# Patient Record
Sex: Female | Born: 1990 | Hispanic: Yes | Marital: Single
Health system: Southern US, Community
[De-identification: ages and names within clinical notes are randomized; demographics above are authoritative.]

---

## 2009-04-17 ENCOUNTER — Emergency Department: Payer: Self-pay | Admitting: Emergency Medicine

## 2010-01-16 ENCOUNTER — Ambulatory Visit: Payer: Self-pay | Admitting: Family Medicine

## 2011-11-15 ENCOUNTER — Emergency Department: Payer: Self-pay | Admitting: Emergency Medicine

## 2011-11-15 LAB — URINALYSIS, COMPLETE
Bacteria: NONE SEEN
Bilirubin,UR: NEGATIVE
Blood: NEGATIVE
Glucose,UR: NEGATIVE mg/dL (ref 0–75)
Hyaline Cast: 2
Nitrite: NEGATIVE
Ph: 9 (ref 4.5–8.0)
Specific Gravity: 1.03 (ref 1.003–1.030)

## 2011-11-15 LAB — COMPREHENSIVE METABOLIC PANEL
Albumin: 4.2 g/dL (ref 3.4–5.0)
Anion Gap: 9 (ref 7–16)
Calcium, Total: 9.4 mg/dL (ref 8.5–10.1)
Chloride: 106 mmol/L (ref 98–107)
Co2: 27 mmol/L (ref 21–32)
EGFR (African American): >60
EGFR (Non-African Amer.): >60
Glucose: 117 mg/dL — ABNORMAL HIGH (ref 65–99)
Osmolality: 283 (ref 275–301)
Potassium: 3.4 mmol/L — ABNORMAL LOW (ref 3.5–5.1)
SGOT(AST): 79 U/L — ABNORMAL HIGH (ref 15–37)
Sodium: 142 mmol/L (ref 136–145)

## 2011-11-15 LAB — CBC
HCT: 41.1 % (ref 35.0–47.0)
HGB: 13.7 g/dL (ref 12.0–16.0)
MCHC: 33.3 g/dL (ref 32.0–36.0)
MCV: 83 fL (ref 80–100)
RDW: 14.9 % — ABNORMAL HIGH (ref 11.5–14.5)

## 2011-11-15 LAB — LIPASE, BLOOD: Lipase: 112 U/L (ref 73–393)

## 2011-12-08 ENCOUNTER — Inpatient Hospital Stay: Payer: Self-pay | Admitting: Surgery

## 2011-12-08 LAB — URINALYSIS, COMPLETE
Bilirubin,UR: NEGATIVE
Glucose,UR: NEGATIVE mg/dL (ref 0–75)
Nitrite: NEGATIVE
Ph: 6 (ref 4.5–8.0)
Protein: 30
Specific Gravity: 1.032 (ref 1.003–1.030)
Squamous Epithelial: 1

## 2011-12-08 LAB — COMPREHENSIVE METABOLIC PANEL
Bilirubin,Total: 0.8 mg/dL (ref 0.2–1.0)
Calcium, Total: 9.2 mg/dL (ref 8.5–10.1)
Chloride: 105 mmol/L (ref 98–107)
Co2: 26 mmol/L (ref 21–32)
Creatinine: 0.59 mg/dL — ABNORMAL LOW (ref 0.60–1.30)
EGFR (African American): >60
EGFR (Non-African Amer.): >60
Glucose: 118 mg/dL — ABNORMAL HIGH (ref 65–99)
Osmolality: 280 (ref 275–301)
Sodium: 139 mmol/L (ref 136–145)

## 2011-12-08 LAB — CBC
HCT: 37.3 % (ref 35.0–47.0)
HGB: 12.4 g/dL (ref 12.0–16.0)
RDW: 15.7 % — ABNORMAL HIGH (ref 11.5–14.5)
WBC: 12 10*3/uL — ABNORMAL HIGH (ref 3.6–11.0)

## 2011-12-09 LAB — COMPREHENSIVE METABOLIC PANEL
Anion Gap: 8 (ref 7–16)
Calcium, Total: 8.7 mg/dL (ref 8.5–10.1)
Chloride: 107 mmol/L (ref 98–107)
Co2: 27 mmol/L (ref 21–32)
EGFR (African American): >60
EGFR (Non-African Amer.): >60
Osmolality: 280 (ref 275–301)
Potassium: 3.4 mmol/L — ABNORMAL LOW (ref 3.5–5.1)
Sodium: 142 mmol/L (ref 136–145)

## 2011-12-09 LAB — CBC WITH DIFFERENTIAL/PLATELET
Basophil #: 0 10*3/uL (ref 0.0–0.1)
Basophil %: 0.7 %
Eosinophil #: 0.1 10*3/uL (ref 0.0–0.7)
HCT: 35.2 % (ref 35.0–47.0)
MCH: 27.1 pg (ref 26.0–34.0)
MCHC: 32.1 g/dL (ref 32.0–36.0)
MCV: 85 fL (ref 80–100)
Monocyte #: 0.3 x10 3/mm (ref 0.2–0.9)
Neutrophil %: 35.6 %
Platelet: 231 10*3/uL (ref 150–440)
RBC: 4.17 10*6/uL (ref 3.80–5.20)
RDW: 16 % — ABNORMAL HIGH (ref 11.5–14.5)
WBC: 4.5 10*3/uL (ref 3.6–11.0)

## 2011-12-09 LAB — APTT: Activated PTT: 29.2 secs (ref 23.6–35.9)

## 2011-12-09 LAB — PROTIME-INR: Prothrombin Time: 13 secs (ref 11.5–14.7)

## 2011-12-10 LAB — CBC WITH DIFFERENTIAL/PLATELET
Basophil #: 0 10*3/uL (ref 0.0–0.1)
Eosinophil #: 0.1 10*3/uL (ref 0.0–0.7)
Eosinophil %: 1.9 %
HCT: 33.5 % — ABNORMAL LOW (ref 35.0–47.0)
Lymphocyte %: 50.3 %
MCHC: 33.2 g/dL (ref 32.0–36.0)
MCV: 84 fL (ref 80–100)
Monocyte %: 9.5 %
Neutrophil #: 1.7 10*3/uL (ref 1.4–6.5)
Neutrophil %: 37.6 %
Platelet: 225 10*3/uL (ref 150–440)
RBC: 4.01 10*6/uL (ref 3.80–5.20)
RDW: 15.9 % — ABNORMAL HIGH (ref 11.5–14.5)

## 2011-12-10 LAB — COMPREHENSIVE METABOLIC PANEL
Anion Gap: 9 (ref 7–16)
Bilirubin,Total: 0.5 mg/dL (ref 0.2–1.0)
Chloride: 109 mmol/L — ABNORMAL HIGH (ref 98–107)
Co2: 27 mmol/L (ref 21–32)
Creatinine: 0.6 mg/dL (ref 0.60–1.30)
EGFR (African American): >60
EGFR (Non-African Amer.): >60
Glucose: 83 mg/dL (ref 65–99)
Sodium: 145 mmol/L (ref 136–145)
Total Protein: 6.1 g/dL — ABNORMAL LOW (ref 6.4–8.2)

## 2011-12-10 LAB — LIPASE, BLOOD: Lipase: 1769 U/L — ABNORMAL HIGH (ref 73–393)

## 2011-12-11 LAB — LIPASE, BLOOD: Lipase: 1690 U/L — ABNORMAL HIGH (ref 73–393)

## 2011-12-11 LAB — COMPREHENSIVE METABOLIC PANEL
Alkaline Phosphatase: 128 U/L (ref 50–136)
Anion Gap: 10 (ref 7–16)
BUN: 3 mg/dL — ABNORMAL LOW (ref 7–18)
Calcium, Total: 8.9 mg/dL (ref 8.5–10.1)
Chloride: 103 mmol/L (ref 98–107)
Co2: 28 mmol/L (ref 21–32)
EGFR (African American): >60
EGFR (Non-African Amer.): >60
Osmolality: 276 (ref 275–301)
Potassium: 3.4 mmol/L — ABNORMAL LOW (ref 3.5–5.1)
SGOT(AST): 21 U/L (ref 15–37)
SGPT (ALT): 79 U/L — ABNORMAL HIGH (ref 12–78)

## 2011-12-11 LAB — CBC WITH DIFFERENTIAL/PLATELET
Basophil #: 0 10*3/uL (ref 0.0–0.1)
Basophil %: 0.7 %
Eosinophil #: 0.1 10*3/uL (ref 0.0–0.7)
HCT: 35.7 % (ref 35.0–47.0)
Lymphocyte #: 1.8 10*3/uL (ref 1.0–3.6)
MCHC: 33.1 g/dL (ref 32.0–36.0)
MCV: 84 fL (ref 80–100)
Monocyte #: 0.4 x10 3/mm (ref 0.2–0.9)
Monocyte %: 8.2 %
Neutrophil #: 2.4 10*3/uL (ref 1.4–6.5)
Platelet: 239 10*3/uL (ref 150–440)
RDW: 15.9 % — ABNORMAL HIGH (ref 11.5–14.5)
WBC: 4.6 10*3/uL (ref 3.6–11.0)

## 2011-12-12 LAB — LIPASE, BLOOD: Lipase: 272 U/L (ref 73–393)

## 2011-12-13 LAB — PATHOLOGY REPORT

## 2014-06-14 NOTE — H&P (Signed)
Subjective/Chief Complaint RUQ pain    History of Present Illness n/v RUQ pain x 1 day nml BM no f/c started yesterday one month postpartum    Past History PMH none PSH none   Past Med/Surgical Hx:  Gallbladder Proplems:   Negative, patient denies medical history.:   ALLERGIES:  No Known Allergies:   Family and Social History:   Family History Non-Contributory    Social History negative tobacco, negative ETOH, fabrication    Place of Living Home   Review of Systems:   Fever/Chills No    Cough No    Abdominal Pain Yes    Diarrhea No    Constipation No    Nausea/Vomiting Yes    SOB/DOE No    Chest Pain No    Dysuria No    Tolerating Diet No  Nauseated    Medications/Allergies Reviewed Medications/Allergies reviewed   Physical Exam:   GEN no acute distress    HEENT pink conjunctivae    NECK supple    RESP normal resp effort  clear BS  no use of accessory muscles    CARD regular rate    ABD positive tenderness  soft  pos Murphy's    EXTR negative edema    PSYCH alert, A+O to time, place, person    Additional Comments no interpretor available. will review and consent upstairs on floor   Lab Results: Hepatic:  13-Oct-13 01:09    Bilirubin, Total 0.8   Alkaline Phosphatase  161   SGPT (ALT)  101   SGOT (AST)  152   Total Protein, Serum 8.2   Albumin, Serum 4.4  Routine Chem:  13-Oct-13 01:09    Glucose, Serum  118   BUN 16   Creatinine (comp)  0.59   Sodium, Serum 139   Potassium, Serum 3.5   Chloride, Serum 105   CO2, Serum 26   Calcium (Total), Serum 9.2   Osmolality (calc) 280   eGFR (African American) >60   eGFR (Non-African American) >60 (eGFR values <59m/min/1.73 m2 may be an indication of chronic kidney disease (CKD). Calculated eGFR is useful in patients with stable renal function. The eGFR calculation will not be reliable in acutely ill patients when serum creatinine is changing rapidly. It is not useful in  patients  on dialysis. The eGFR calculation may not be applicable to patients at the low and high extremes of body sizes, pregnant women, and vegetarians.)   Anion Gap 8   Lipase 130 (Result(s) reported on 08 Dec 2011 at 02:06AM.)  Routine UA:  13-Oct-13 03:08    Color (UA) Amber   Clarity (UA) Hazy   Glucose (UA) Negative   Bilirubin (UA) Negative   Ketones (UA) Trace   Specific Gravity (UA) 1.032   Blood (UA) 2+   pH (UA) 6.0   Protein (UA) 30 mg/dL   Nitrite (UA) Negative   Leukocyte Esterase (UA) Negative (Result(s) reported on 08 Dec 2011 at 04:34AM.)   RBC (UA) 28 /HPF   WBC (UA) 4 /HPF   Bacteria (UA) NONE SEEN   Epithelial Cells (UA) 1 /HPF   Mucous (UA) PRESENT (Result(s) reported on 08 Dec 2011 at 04:34AM.)  Routine Hem:  13-Oct-13 01:09    WBC (CBC)  12.0   RBC (CBC) 4.50   Hemoglobin (CBC) 12.4   Hematocrit (CBC) 37.3   Platelet Count (CBC) 302 (Result(s) reported on 08 Dec 2011 at 02:21AM.)   MCV 83   MCH 27.6   MCHC  33.3   RDW  15.7   Radiology Results: [place]:    13-Oct-13 05:32, US Abdomen Limited Survey   US Abdomen Limited Survey   REASON FOR EXAM:    KNOWN GSTONES; TONIGHT W/ INCREASED PAIN & VOMITING,   EVAL CHOLECYSTITIS  COMMENTS:   Body Site: GB and Fossa, CBD, Head of Pancreas    PROCEDURE: [place]  - US ABDOMEN LIMITED SURVEY  - Dec 08 2011  5:32AM     RESULT: Comparison: 11/16/2011    Technique: Multiple grayscale and color Doppler images were obtained of   the right upper quadrant.    Findings:  The visualized pancreas is unremarkable. Multiple gallstones are present.   No gallbladder wall thickening. There may be a trace amount of   pericholecystic fluid, similar to prior. The sonographic Percell Miller sign was     negative.    The common bile duct is dilated, measuring 8 mm. There is a round 5 mm   echogenic focus within the common bile duct which demonstrate shadowing   and is consistent with a stone.    IMPRESSION:   1. There is a 5 mm calculus within  the common bile duct causing mild   dilatation of the common bile duct.  2. Cholelithiasis. There is a trace amount of pericholecystic fluid   similar to prior. Otherwise, no sonographic evidence of acute   cholecystitis.    The preliminary report was provided to the emergency department shortly   after examination.    Verified By: Gregor Hams, M.D., MD     Assessment/Admission Diagnosis choledocholithiasis admit hydrtate IV abx trend LFT and plan LC or ERCP accordingly will review woith interpretor on floor   Electronic Signatures: Florene Glen (MD)  (Signed 13-Oct-13 07:37)  Authored: CHIEF COMPLAINT and HISTORY, PAST MEDICAL/SURGIAL HISTORY, ALLERGIES, FAMILY AND SOCIAL HISTORY, REVIEW OF SYSTEMS, PHYSICAL EXAM, LABS, Radiology, ASSESSMENT AND PLAN   Last Updated: 13-Oct-13 07:37 by Florene Glen (MD)

## 2014-06-14 NOTE — Discharge Summary (Signed)
PATIENT NAME:  Sandra Escobar, Sandra MR#:  [id number] DATE OF BIRTH:  1990/10/16  DATE OF ADMISSION:  12/08/2011 DATE OF DISCHARGE:  12/12/2011  ATTENDING PHYSICIAN: Dr. Ida Roguehristopher Shaely Gadberry   DISCHARGE DIAGNOSES:  1. Symptomatic cholelithiasis.  2. Post endoscopic retrograde cholangiopancreatography pancreatitis.   PROCEDURES PERFORMED:  1. ERCP 12/09/2011. 2. Laparoscopic cholecystectomy 12/11/2011.   DISCHARGE MEDICATION: Percocet 1 to 2 tabs p.o. q.4 hours p.r.n. pain.   HOSPITAL COURSE: [name] presented on 10/13 with recurrent right upper quadrant pain. She had an ultrasound at that time which showed gallstones and a dilated bile duct. She was admitted for symptomatic cholelithiasis, however, with her dilated bile duct we consulted gastroenterology and she underwent ERCP. She had a sphincterotomy but did not have any visualized stones. Postoperatively she had a lipase that was elevated yet was asymptomatic. We hydrated her and watched her for one day and continued to be asymptomatic. She underwent laparoscopic cholecystectomy on 10/16. Postoperatively she did fine. Her lipase had returned to close to normal postoperatively and pain was controlled with p.o. pain medications. She was discharged home in satisfactory condition.   DISCHARGE INSTRUCTIONS: She is sent home with a prescription for pain medication. She is told to call or return to ED if she has fevers, nausea, vomiting, increased pain or redness, drainage from incision. She is to follow up with me in approximately one week.   ____________________________ Si Raiderhristopher A. Amabel Stmarie, MD cal:cms D: 12/17/2011 15:37:28 ET T: 12/17/2011 15:52:20 ET  JOB#: [id number] cc: Cristal Deerhristopher A. Broox Lonigro, MD, <Dictator> Jarvis NewcomerHRISTOPHER A Rabab Currington MD ELECTRONICALLY SIGNED 12/20/2011 16:32

## 2014-06-14 NOTE — Consult Note (Signed)
Chief Complaint:   Subjective/Chief Complaint Elevated lipase but patient asymptomatic. Had GB removed earlier. Still no abd pain or nausea.   VITAL SIGNS/ANCILLARY NOTES: **Vital Signs.:   16-Oct-13 16:02   Vital Signs Type Post-Procedure   Temperature Temperature (F) 98.3   Celsius 36.8   Temperature Source Oral   Pulse Pulse 76   Respirations Respirations 18   Systolic BP Systolic BP 160   Diastolic BP (mmHg) Diastolic BP (mmHg) 66   Mean BP 80   Pulse Ox % Pulse Ox % 98   Pulse Ox Activity Level  At rest   Oxygen Delivery Room Air/ 21 %   Brief Assessment:   Respiratory clear BS    Gastrointestinal Normal   Lab Results: Hepatic:  16-Oct-13 04:49    Bilirubin, Total 0.6   Alkaline Phosphatase 128   SGPT (ALT)  79   SGOT (AST) 21   Total Protein, Serum 6.7   Albumin, Serum 3.4  Routine Chem:  16-Oct-13 04:49    Glucose, Serum 72   BUN  3   Creatinine (comp)  0.45   Sodium, Serum 141   Potassium, Serum  3.4   Chloride, Serum 103   CO2, Serum 28   Calcium (Total), Serum 8.9   Osmolality (calc) 276   eGFR (African American) >60   eGFR (Non-African American) >60 (eGFR values <23m/min/1.73 m2 may be an indication of chronic kidney disease (CKD). Calculated eGFR is useful in patients with stable renal function. The eGFR calculation will not be reliable in acutely ill patients when serum creatinine is changing rapidly. It is not useful in  patients on dialysis. The eGFR calculation may not be applicable to patients at the low and high extremes of body sizes, pregnant women, and vegetarians.)   Anion Gap 10   Lipase  1690 (Result(s) reported on 11 Dec 2011 at 05:35AM.)  Routine Hem:  16-Oct-13 04:49    WBC (CBC) 4.6   RBC (CBC) 4.27   Hemoglobin (CBC)  11.8   Hematocrit (CBC) 35.7   Platelet Count (CBC) 239   MCV 84   MCH 27.6   MCHC 33.1   RDW  15.9   Neutrophil % 51.0   Lymphocyte % 38.6   Monocyte % 8.2   Eosinophil % 1.5   Basophil % 0.7    Neutrophil # 2.4   Lymphocyte # 1.8   Monocyte # 0.4   Eosinophil # 0.1   Basophil # 0.0 (Result(s) reported on 11 Dec 2011 at 05:37AM.)   Assessment/Plan:  Assessment/Plan:   Assessment Post ERCP pancreatitis? S/P GB surgery. Stable.    Plan Agree with clear liquid diet. If tolerated with stable lipase tomorrow, can be discharged tomrrow. Will check back tomorrow if still here. Thanks.   Electronic Signatures: OVerdie Shire(MD)  (Signed 16-Oct-13 16:33)  Authored: Chief Complaint, VITAL SIGNS/ANCILLARY NOTES, Brief Assessment, Lab Results, Assessment/Plan   Last Updated: 16-Oct-13 16:33 by OVerdie Shire(MD)

## 2014-06-14 NOTE — Consult Note (Signed)
PATIENT NAME:  Sandra Escobar, Sandra Escobar MR#:  [id number] DATE OF BIRTH:  [birth year]  DATE OF CONSULTATION:  12/09/2011  REFERRING PHYSICIAN:   CONSULTING PHYSICIAN:  Scot Junobert T. Elliott, MD  HISTORY OF PRESENT ILLNESS: Patient is a 24 year old Hispanic female who speaks a little English was seen in the ER, found to have choledocholithiasis and was admitted to the hospital. She has a history of abdominal pain for one day. GI was consulted for possible ERCP.   PAST MEDICAL HISTORY: Patient is one month postpartum. She breast leads. She has not had any narcotics in 24 hours.   PAST SURGICAL HISTORY: None.   ALLERGIES: None.   CURRENT MEDICATIONS: None.   HABITS: Does not smoke or drink.   REVIEW OF SYSTEMS: Performed by admitting physician negative. No complaints of chest pain. No shortness of breath.   PHYSICAL EXAMINATION:  GENERAL: Pleasant Hispanic female in no acute distress.   HEENT: Sclerae nonicteric. Conjunctivae negative. Tongue is negative. The head is atraumatic.   NECK: Trachea is in the midline.   CHEST: Clear to auscultation.   HEART: No murmurs or gallops I can hear.   BREASTS: Exam not done at this time.   ABDOMEN: Bowel sounds present. No hepatosplenomegaly. No masses. No bruits. Minimal tenderness right upper quadrant.   VITAL SIGNS: Temperature 97.8, pulse 60, blood pressure 107/69, pulse oximetry 99%.   SKIN: Warm and dry.   NOTE: Patient was interviewed through hospital provided Spanish interpreter.   LABORATORY, DIAGNOSTIC AND RADIOLOGICAL DATA: Glucose 78, BUN 7, creatinine 0.55, sodium 142, potassium 3.4, chloride 107, CO2 27, calcium 8.7, lipase 130, total protein 6.5, albumin 3.3, total bilirubin 0.7, alkaline phosphatase 154, SGOT 84, SGPT 160. White count initially 12, repeat 4.5. Hemoglobin now 11.3, platelet count 231. Urinalysis shows blood, 28 red cells per high-powered field, negative nitrites, negative leukocyte esterase. An ultrasound of the  abdomen showed choledocholithiasis.   ASSESSMENT: Common bile duct stone.   RECOMMENDATIONS:  1. Check stat PT and PTT.  2. Keep her n.p.o.  3. No narcotics if patient is going to breast feed.  4. Proceed with ERCP with Dr. Bluford Kaufmannh. I have explained the procedure to the patient through the Spanish interpreter about the indications, expected results, possible complications including bleeding and pancreatitis. Dr. Bluford Kaufmannh will proceed this morning with an ERCP.   ____________________________ Scot Junobert T. Elliott, MD rte:cms D: 12/09/2011 09:02:18 ET T: 12/09/2011 09:14:44 ET JOB#: [id number]  cc: Scot Junobert T. Elliott, MD, <Dictator> Ezzard StandingPaul Y. Bluford Kaufmannh, MD Si Raiderhristopher A. Juliann PulseLundquist, MD  Scot JunOBERT T ELLIOTT MD ELECTRONICALLY SIGNED 12/18/2011 8:05

## 2014-06-14 NOTE — H&P (Signed)
PATIENT NAME:  Sandra Escobar, Sandra Escobar MR#:  [id number] DATE OF BIRTH:  [birth year]  DATE OF ADMISSION:  12/08/2011  CHIEF COMPLAINT: Right upper quadrant pain.   HISTORY OF PRESENT ILLNESS: This is a patient who speaks some English who is interviewed in the Emergency Room. I was called by the ED doctor who suggested the patient had choledocholithiasis. The patient describes one day of abdominal pain. She is one month postpartum and has never had an episode like this before. She has had some nausea and vomiting. Denies abnormal bowel movements. No jaundice or acholic stools. No fevers or chills.   PAST MEDICAL HISTORY: None.   PAST SURGICAL HISTORY: None.   ALLERGIES: None.   MEDICATIONS: None.   FAMILY HISTORY: Noncontributory.   SOCIAL HISTORY: The patient does not smoke or drink. She is one month postpartum and works as a Psychologist, occupationalfabricator.   REVIEW OF SYSTEMS: Ten system review was performed and negative with the exception of that mentioned in the history of present illness.   PHYSICAL EXAMINATION:  GENERAL: Healthy, comfortable-appearing Hispanic female patient.   VITAL SIGNS: Temperature 97.6, pulse 54, respirations 20, blood pressure 100/52. Pain scale of 0, 100% room air sat.   HEENT: No scleral icterus.   NECK: No palpable neck nodes.   CHEST: Clear to auscultation.   CARDIAC: Regular rate and rhythm.   ABDOMEN: Soft, minimally tender in the right upper quadrant. There is a questionable Murphy's sign.   EXTREMITIES: Without edema. Calves are nontender.   NEUROLOGIC: Grossly intact.   INTEGUMENT: No jaundice.   LABORATORY, RADIOLOGICAL AND DIAGNOSTIC DATA: Alkaline phosphatase 161, AST/ALT is 152/101. Bilirubin is normal. Electrolytes are normal. White blood cell count is 12.0. Hemoglobin and hematocrit of 12.4 and 37.3 and a platelet count of 302. An ultrasound suggests stone in the common bile duct with dilatation.   ASSESSMENT AND PLAN: This is a patient with  choledocholithiasis. No interpreter was available in the Emergency Room, but I was able to obtain the patient's history. I will review that history with an interpreter once one is available upstairs. The plan is to admit the patient to the hospital, hydrate, start IV antibiotics and then trend her liver function tests and decide upon laparoscopic cholecystectomy or ERCP accordingly. This will be discussed with her when she is on the floor.  ____________________________ Adah Salvageichard E. Excell Seltzerooper, MD rec:ap D: 12/08/2011 07:40:35 ET T: 12/08/2011 10:00:06 ET JOB#: [id number]  cc: Adah Salvageichard E. Excell Seltzerooper, MD, <Dictator> Lattie HawICHARD E Harvey Lingo MD ELECTRONICALLY SIGNED 12/08/2011 12:27

## 2014-06-14 NOTE — Consult Note (Signed)
CC: elevated lipase after ERCP.  She has elevated lipase but no complaints of abd pain and is not tender on my exam at this time.  Chest clear.  Will keep iv rate at 150cc/hr.  BUN is 4 so she is well hydrated.  I think this is not at this point a signif pancreatitis post ERCP but more of irritation and expect the lipase to fall pretty soon.  Electronic Signatures: Scot JunElliott, Tai Syfert T (MD)  (Signed on 15-Oct-13 12:10)  Authored  Last Updated: 15-Oct-13 12:10 by Scot JunElliott, Darald Uzzle T (MD)

## 2014-06-14 NOTE — Op Note (Signed)
PATIENT NAME:  Sandra Escobar, Mithra MR#:  [id number] DATE OF BIRTH:  [birth year]  DATE OF PROCEDURE:  12/11/2011  PREOPERATIVE DIAGNOSIS: Symptomatic cholelithiasis.   POSTOPERATIVE DIAGNOSIS:  Symptomatic cholelithiasis.  PROCEDURE PERFORMED: Laparoscopic cholecystectomy.   ESTIMATED BLOOD LOSS: 10 mL.  SURGEON: Kindall Swaby A. Alon Mazor, M.D.   ANESTHESIA: General.   SPECIMENS: Gallbladder.   INDICATION FOR SURGERY: [name] is a pleasant 24 year old female with gallstones, had recurrent right upper quadrant pain, who was interested in undergoing laparoscopic cholecystectomy.   DETAILS OF PROCEDURE: [name]' informed consent was obtained. She was brought in the operating room suite. She was laid supine on the operating room table. She was induced, endotracheal tube was placed, and general anesthesia was administered. Her abdomen was prepped and draped in standard surgical fashion. A transverse supraumbilical incision was made. This was taken down to the fascia. The fascia was incised. The peritoneum was entered with a gloved finger and there were no obvious adhesions. 2-0 Vicryl stay sutures were placed through the fascia. A Hassan trocar was placed in the abdomen.  The abdomen was insufflated.  The gallbladder was examined. There was some edema in the abdomen seconary to her post ERCP pancreatitis. Under direct visualization an 11-mm subxiphoid port was placed, and then two 5-mm ports were placed approximately 2 cm below the costal margin in the midclavicular and anterior axillary line. The gallbladder was then grasped with an instrument placed through the lateralmost port. The fundus was then retracted over the dome of the liver. The cystic duct and cystic artery were then dissected out. The critical view was obtained. The cystic duct and cystic artery were then clipped and ligated. The gallbladder was then taken off the gallbladder fossa using hook cautery  and taken out through a supraumbilical port in an Endo Catch bag. The fossa was examined. It was noted to be hemostatic. The trocars were then removed. The previously placed stay sutures were tied to close the supraumbilical fascia. The skin was then closed with 4-0 Monocryl deep dermal sutures.  Dermabond was then placed over the wounds.  The drapes were then taken down. The patient was then awakened and taken to the postanesthesia care unit. There were no immediate complications. The needle, sponge, and instrument counts were correct at the end of the procedure.     ____________________________ Si Raiderhristopher A. Markise Haymer, MD cal:bjt D: 12/11/2011 15:55:52 ET T: 12/11/2011 17:08:57 ET JOB#: [id number]  cc: Cristal Deerhristopher A. Renn Dirocco, MD, <Dictator> Jarvis NewcomerHRISTOPHER A Bertie Mcconathy MD ELECTRONICALLY SIGNED 12/15/2011 9:51

## 2014-06-14 NOTE — Consult Note (Signed)
ERCP performed. Filling defect seen on initial cholangiogram. However, multiple balloon sweeps showed no stones. GB filled. Ok to proceed with GB surgery. Ordered CMP and lipase for tomorrow AM. Thanks.  Electronic Signatures: Lylian Sanagustin (MLutricia Feil)  (Signed on 14-Oct-13 11:31)  Authored  Last Updated: 14-Oct-13 11:31 by Lutricia Feilh, Caralyn Twining (MD)

## 2016-07-08 ENCOUNTER — Emergency Department: Payer: No Typology Code available for payment source

## 2016-07-08 ENCOUNTER — Encounter: Payer: Self-pay | Admitting: Emergency Medicine

## 2016-07-08 ENCOUNTER — Emergency Department
Admission: EM | Admit: 2016-07-08 | Discharge: 2016-07-08 | Disposition: A | Discharge status: Home / Self Care | Payer: No Typology Code available for payment source | Attending: Emergency Medicine | Admitting: Emergency Medicine

## 2016-07-08 DIAGNOSIS — Y9389 Activity, other specified: Secondary | ICD-10-CM | POA: Diagnosis not present

## 2016-07-08 DIAGNOSIS — Y92410 Unspecified street and highway as the place of occurrence of the external cause: Secondary | ICD-10-CM | POA: Insufficient documentation

## 2016-07-08 DIAGNOSIS — Y999 Unspecified external cause status: Secondary | ICD-10-CM | POA: Insufficient documentation

## 2016-07-08 DIAGNOSIS — S0990XA Unspecified injury of head, initial encounter: Secondary | ICD-10-CM | POA: Diagnosis present

## 2016-07-08 DIAGNOSIS — M542 Cervicalgia: Secondary | ICD-10-CM | POA: Diagnosis not present

## 2016-07-08 DIAGNOSIS — R51 Headache: Secondary | ICD-10-CM | POA: Insufficient documentation

## 2016-07-08 MED ORDER — MELOXICAM 7.5 MG PO TABS: 7.5000 mg | ORAL_TABLET | Freq: Every day | ORAL | 1 refills | Status: AC
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
## 2018-11-18 ENCOUNTER — Other Ambulatory Visit: Payer: Self-pay | Admitting: Primary Care

## 2018-11-18 DIAGNOSIS — Z3482 Encounter for supervision of other normal pregnancy, second trimester: Secondary | ICD-10-CM

## 2018-11-26 ENCOUNTER — Other Ambulatory Visit: Payer: Self-pay

## 2018-11-26 ENCOUNTER — Ambulatory Visit
Admission: RE | Admit: 2018-11-26 | Discharge: 2018-11-26 | Disposition: A | Discharge status: Home / Self Care | Payer: Self-pay | Source: Ambulatory Visit | Attending: Primary Care | Admitting: Primary Care

## 2018-11-26 DIAGNOSIS — Z3482 Encounter for supervision of other normal pregnancy, second trimester: Secondary | ICD-10-CM | POA: Insufficient documentation

## 2018-12-08 ENCOUNTER — Other Ambulatory Visit: Payer: Self-pay | Admitting: Primary Care

## 2018-12-08 DIAGNOSIS — Z3482 Encounter for supervision of other normal pregnancy, second trimester: Secondary | ICD-10-CM

## 2018-12-25 ENCOUNTER — Ambulatory Visit
Admission: RE | Admit: 2018-12-25 | Discharge: 2018-12-25 | Disposition: A | Discharge status: Home / Self Care | Payer: Self-pay | Source: Ambulatory Visit | Attending: Primary Care | Admitting: Primary Care

## 2018-12-25 ENCOUNTER — Other Ambulatory Visit: Payer: Self-pay

## 2018-12-25 DIAGNOSIS — Z3482 Encounter for supervision of other normal pregnancy, second trimester: Secondary | ICD-10-CM | POA: Insufficient documentation

## 2019-11-15 IMAGING — US US OB COMP +14 WK
1 series · 13 of 28 positions shown · non-contrast
Comparison: none

CLINICAL DATA: Scan for anatomy. Patient is 28, gravida 4 para 3.

EXAM:
OBSTETRICAL ULTRASOUND >14 WKS

[Series 1: us ob comp +14 wk · 13 of 114 slices shown]
[im 5/114]
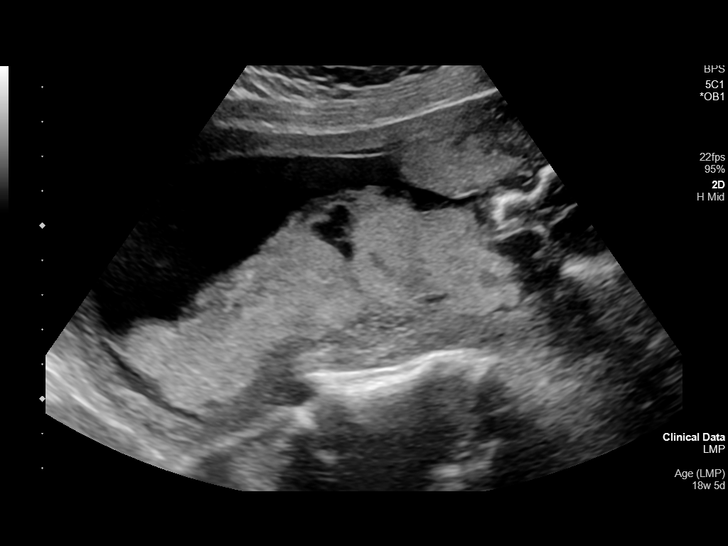
[im 13/114]
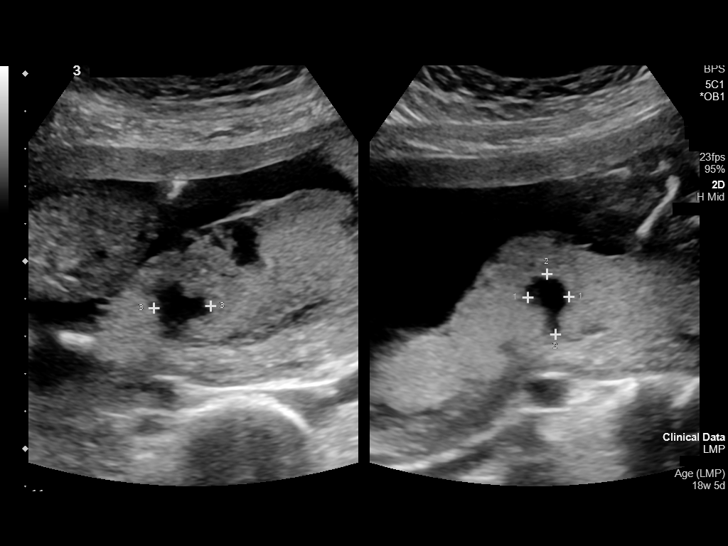
[im 21/114]
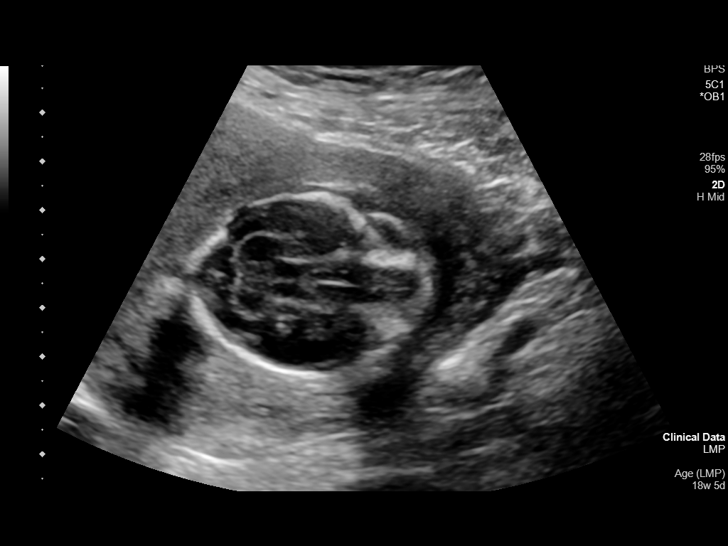
[im 30/114]
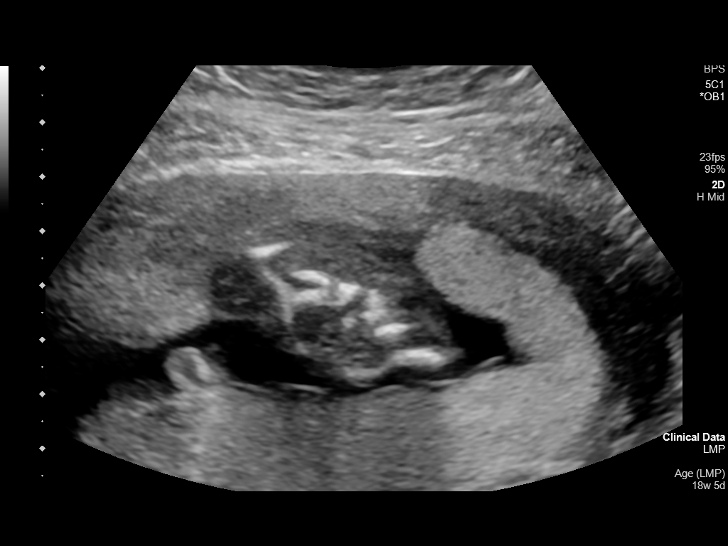
[im 38/114]
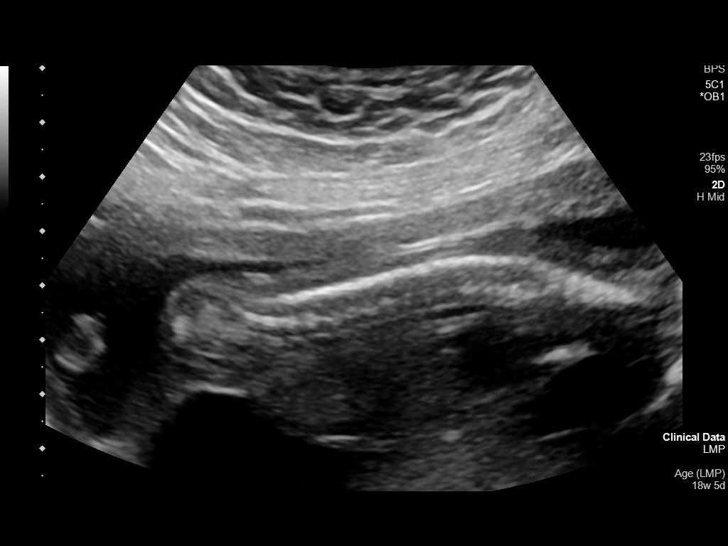
[im 47/114]
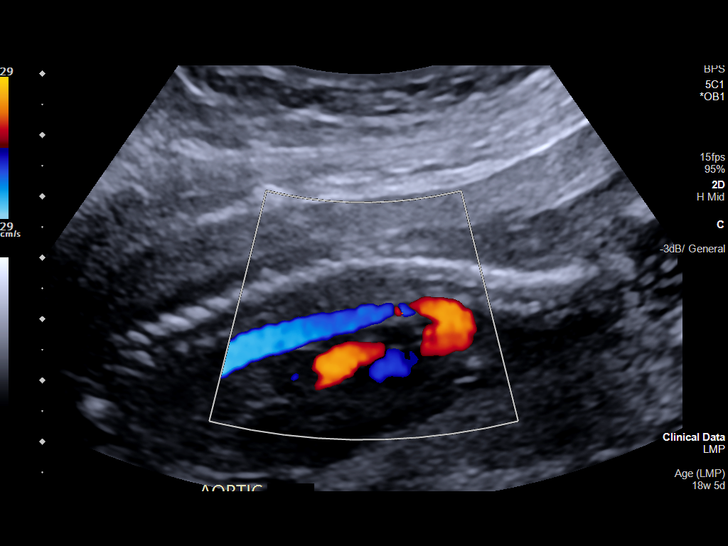
[im 59/114]
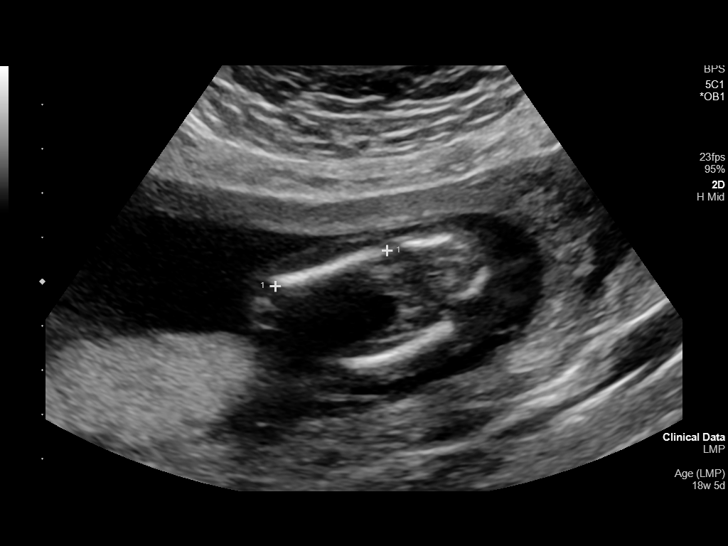
[im 67/114]
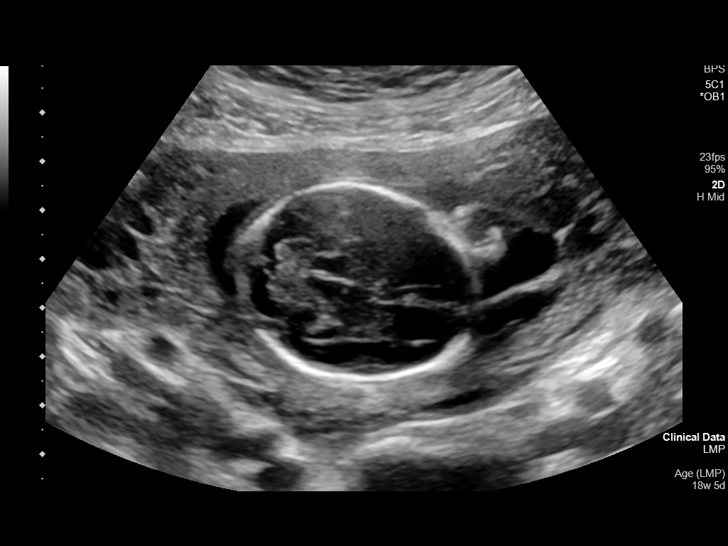
[im 76/114]
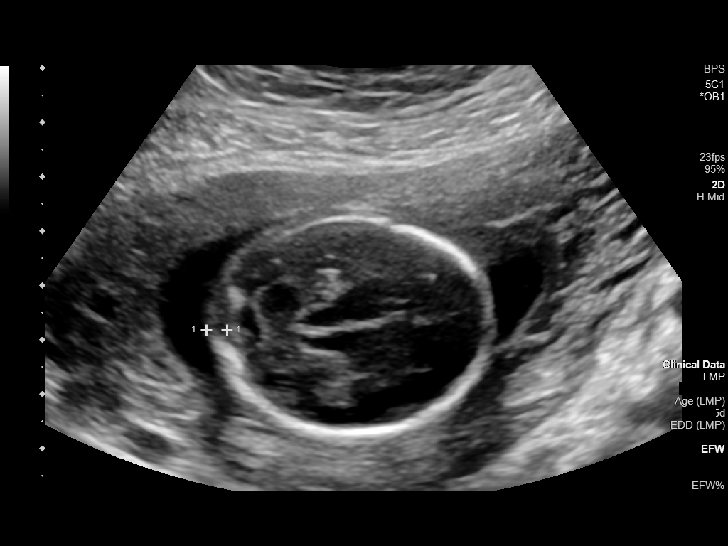
[im 84/114]
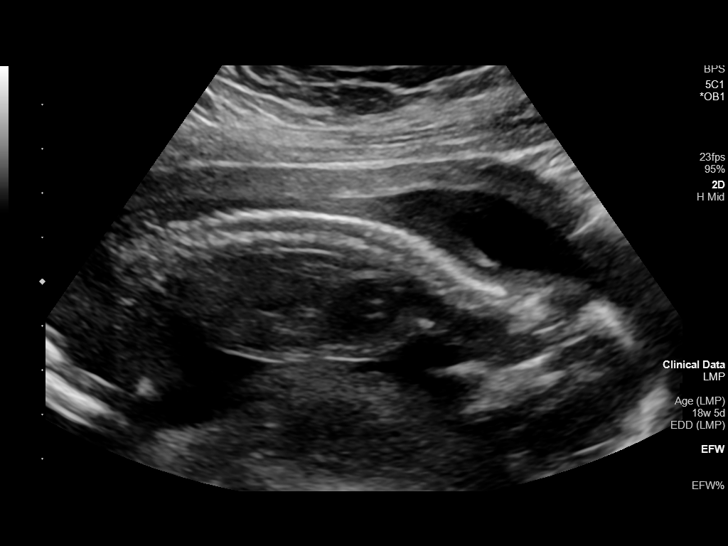
[im 93/114]
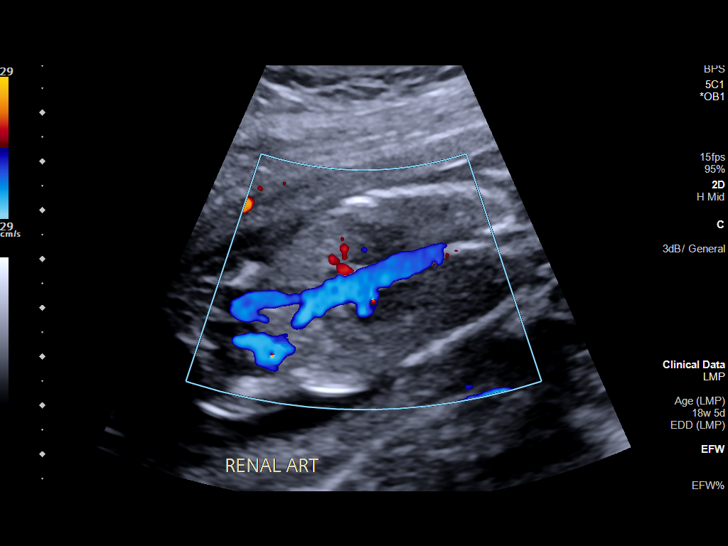
[im 101/114]
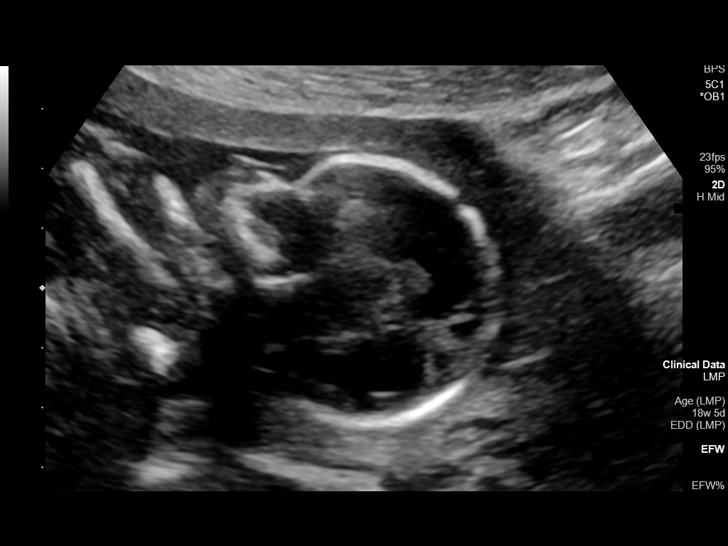
[im 109/114]
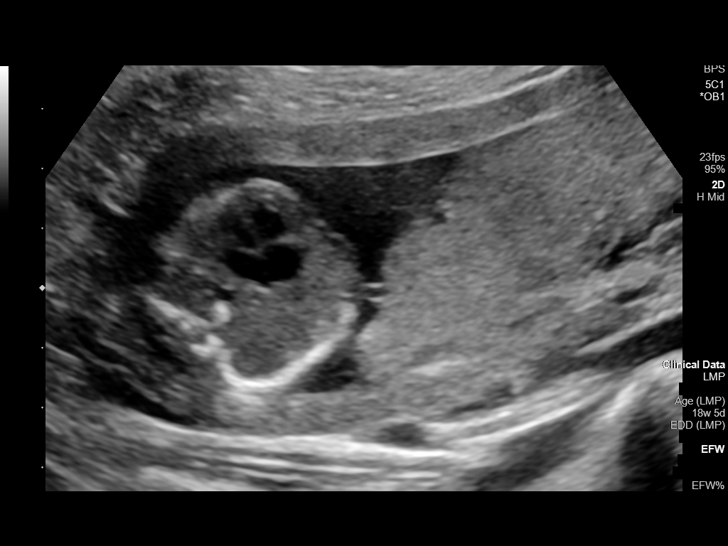

[13 of 28 positions shown; findings below may reference images not displayed]

FINDINGS: Number of Fetuses: 1

Heart Rate:  149 bpm

Movement: Present

Presentation: Variable

Previa: None

Placental Location: Posterior

Amniotic Fluid (Subjective): Normal

Amniotic Fluid (Objective):

Vertical pocket = 4.5cm

FETAL BIOMETRY

BPD: 3.9cm 18w 0d

HC:   14.3cm 17w 4d

AC:   12.6cm 18w 1d

FL:   2.6cm 18w 0d

Current Mean GA: 18w 0d US EDC: 04/29/2019

Assigned GA:  18w 5d Assigned EDC: 04/24/2019

FETAL ANATOMY

Lateral Ventricles: Appears normal

Thalami/CSP: Appears normal

Posterior Fossa:  Appears normal

Nuchal Region: Appears normal   NFT= 3.8 millimeters

Upper Lip: Limited views appear normal

Spine: Appears normal

4 Chamber Heart on Left: Limited views appear normal

LVOT: Appears normal

RVOT: Appears normal

Stomach on Left: Appears normal

3 Vessel Cord: Appears normal

Cord Insertion site: Appears normal

Kidneys: Appears normal

Bladder: Appears normal

Extremities: Appears normal

Technically difficult due to: Fetal position

Maternal Findings:

Cervix:  3.8 centimeters on transabdominal evaluation
IMPRESSION: 1. Single living intrauterine fetus in variable presentation.
2. No fetal anomalies identified.
3. Limited visualization of the heart, spine, and gender, nose, and
lips. Consider follow-up scan for anatomy.

## 2019-12-14 IMAGING — US US OB FOLLOW-UP
1 series · 14 of 28 positions shown · non-contrast
Comparison: none

CLINICAL DATA: Pregnancy.  Fetal anatomy evaluation.

EXAM:
OBSTETRIC 14+ WK ULTRASOUND FOLLOW-UP

[Series 1: us ob follow-up · 14 of 43 slices shown]
[im 2/43]
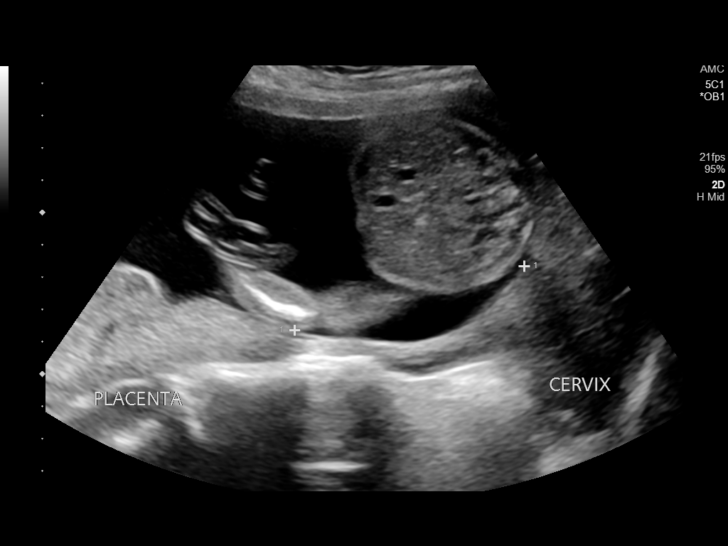
[im 5/43]
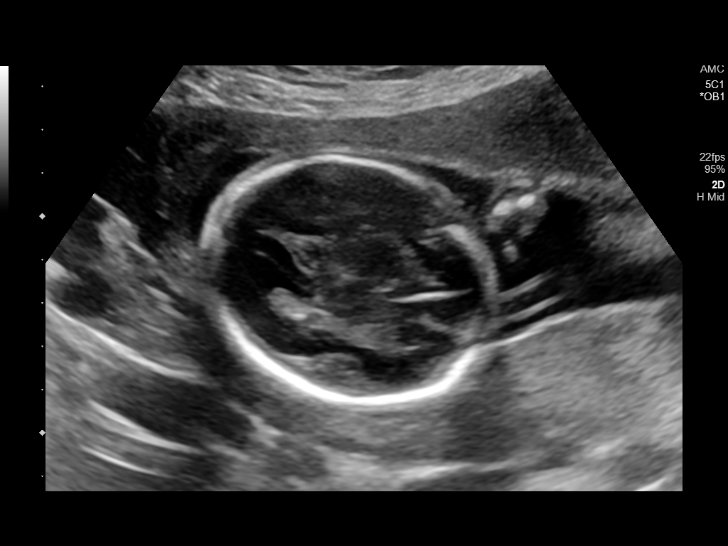
[im 8/43]
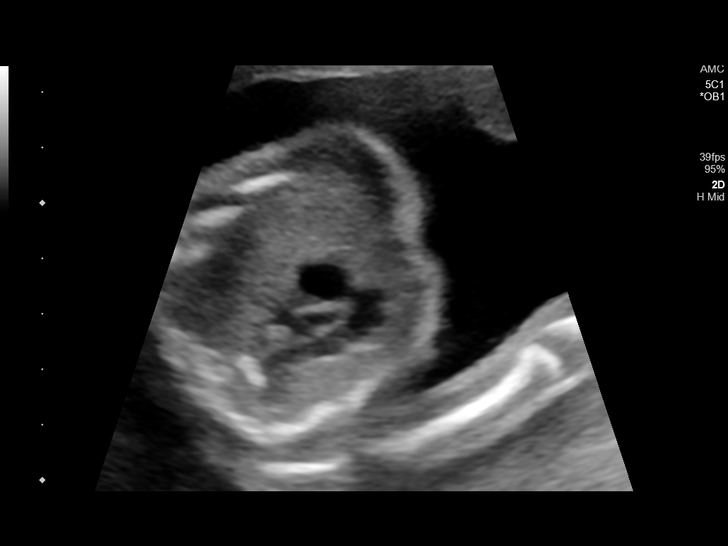
[im 11/43]
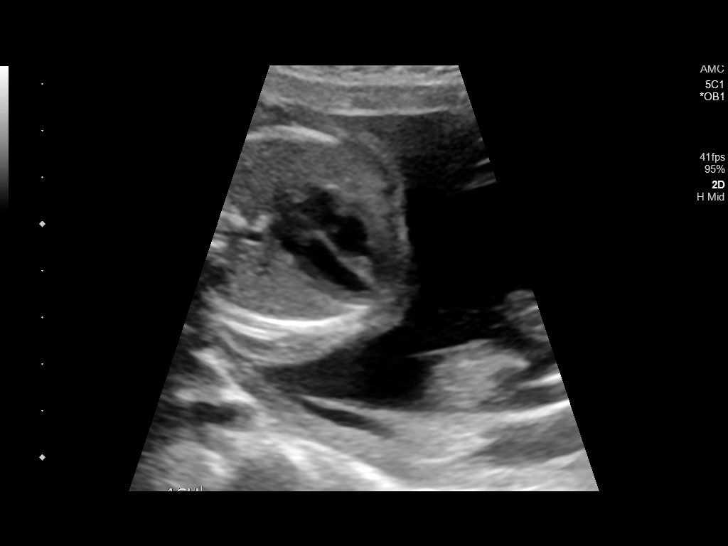
[im 15/43]
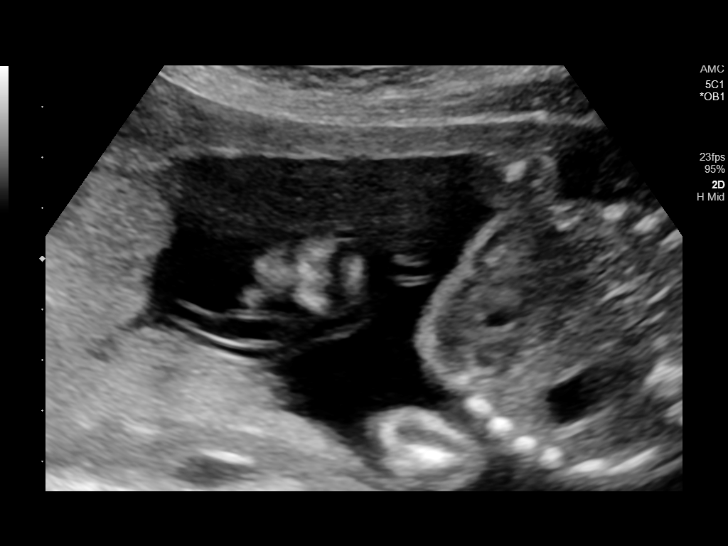
[im 18/43]
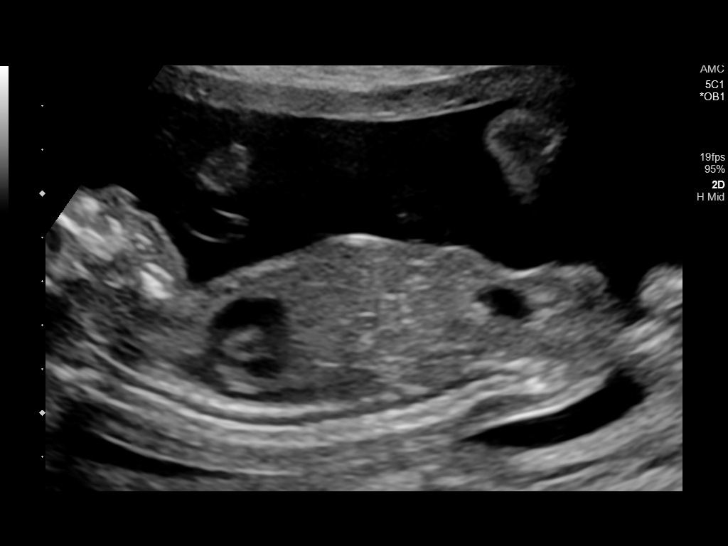
[im 21/43]
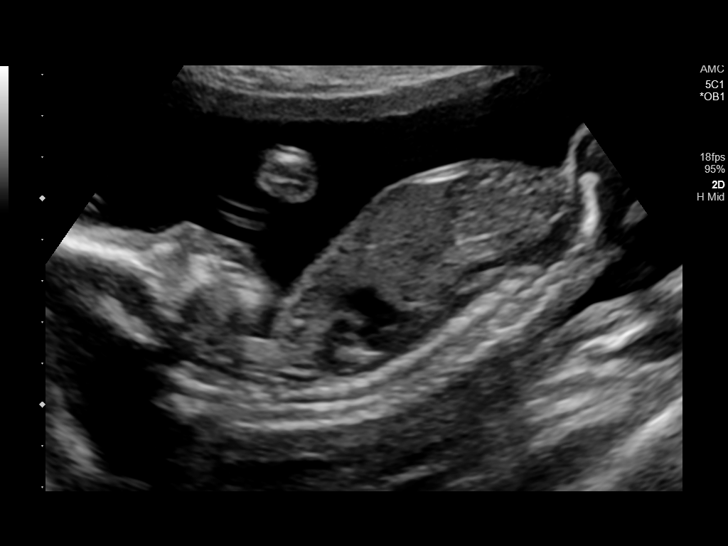
[im 24/43]
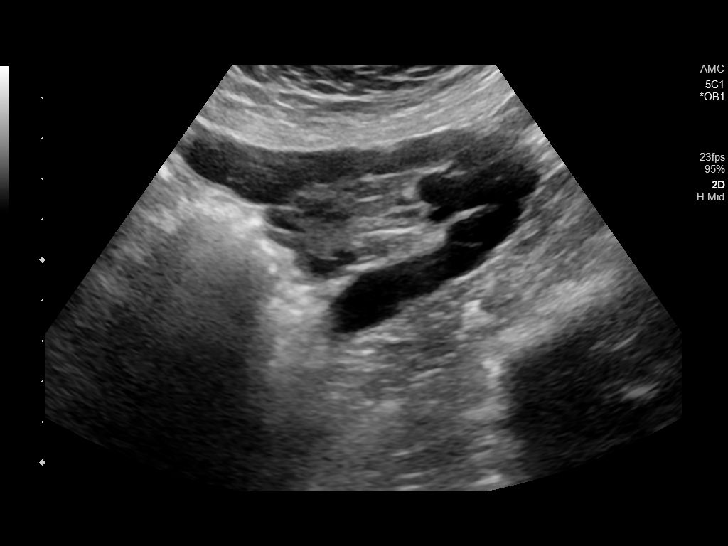
[im 27/43]
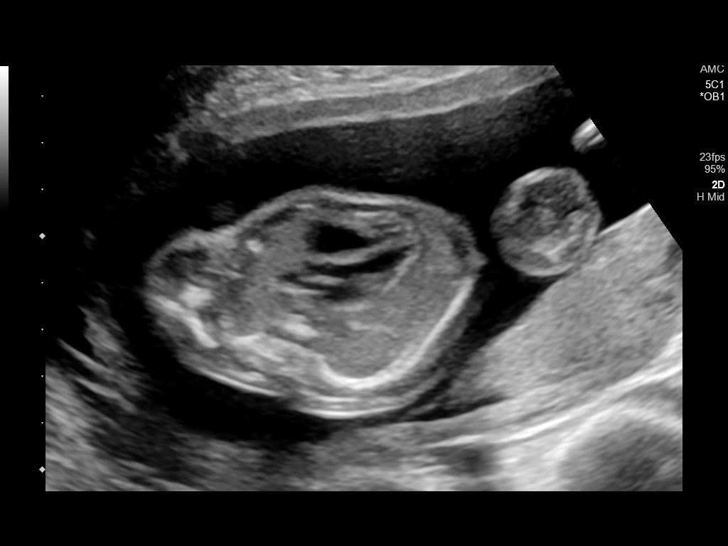
[im 30/43]
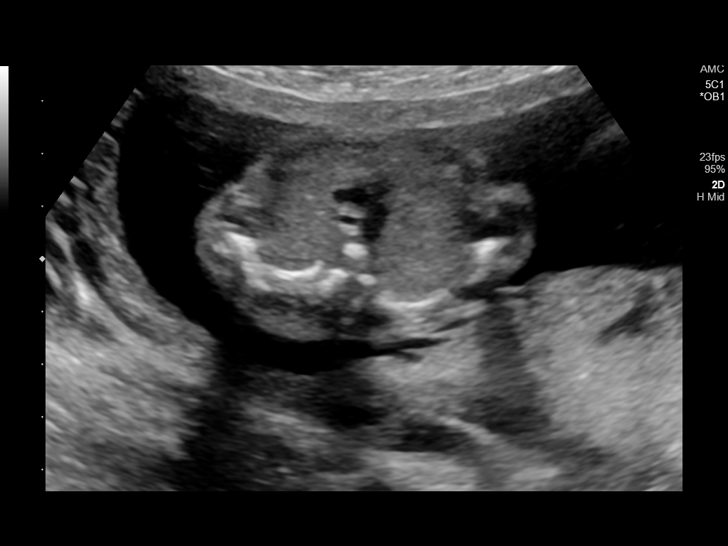
[im 33/43]
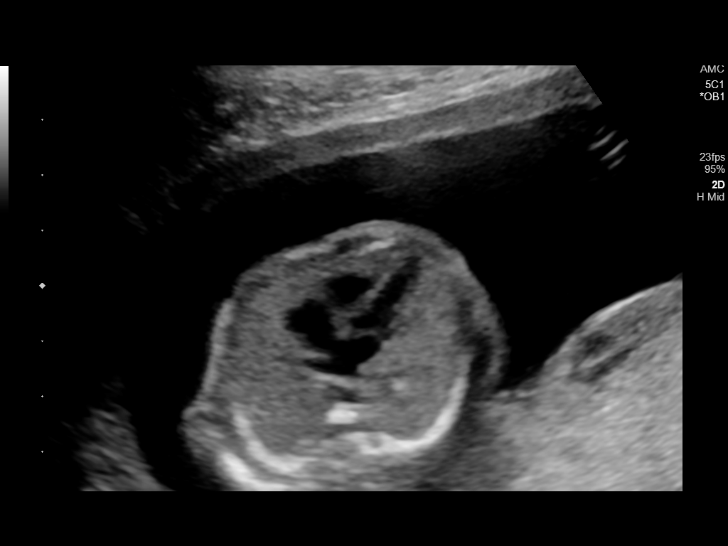
[im 36/43]
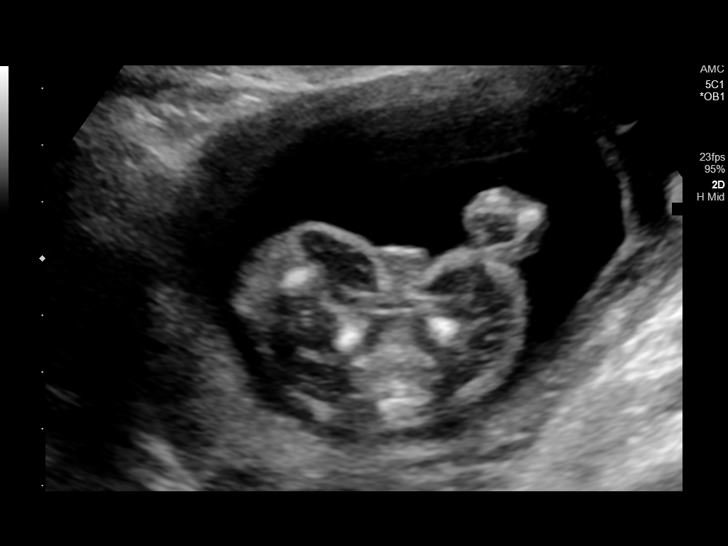
[im 39/43]
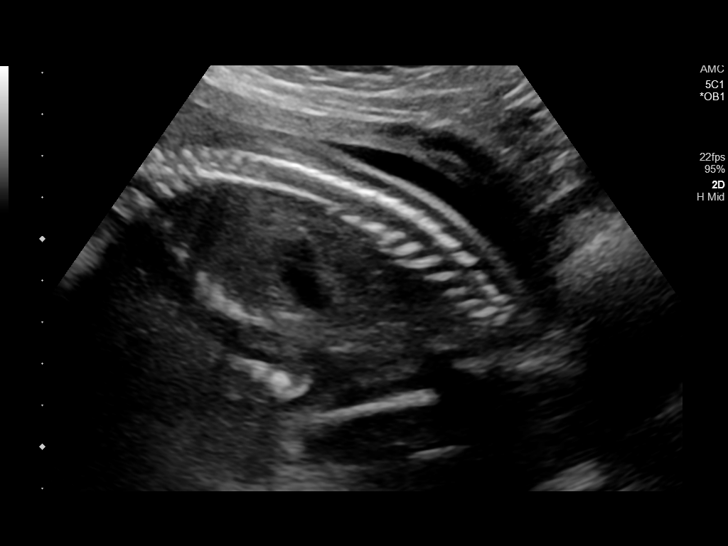
[im 43/43]
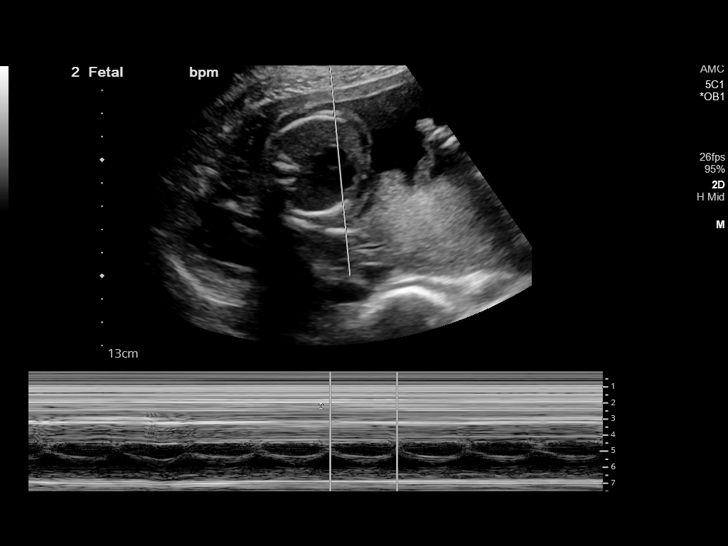

[14 of 28 positions shown; findings below may reference images not displayed]

FINDINGS: Number of Fetuses: 1

Heart Rate: 153 bpm

Movement: Present

Presentation: Breech

Previa: No

Placental Location: Normal

Assigned GA: 22w 1d     Assigned EDC: 04/29/2019

FETAL ANATOMY

Lateral Ventricles: Appears normal

Thalami/CSP: Previously evaluated

Posterior Fossa: Previously

Nuchal Region: Previously evaluated

Upper Lip: Appears normal

Spine: Appears normal

4 Chamber Heart on Left: Appears normal

LVOT: Appears normal

RVOT: Appears normal

Stomach on Left: Appears normal

3 Vessel Cord: Previously evaluated

Cord Insertion site: Previously evaluated

Kidneys: Appears normal

Bladder: Appears normal

Extremities: Previously evaluated

Maternal Findings:

Cervix:  9 cm and closed.
IMPRESSION: Viable intrauterine pregnancy in breech presentation. On today's
exam the heart, spine, nose/lips appear within normal limits.

## 2021-07-08 ENCOUNTER — Encounter (HOSPITAL_COMMUNITY): Payer: Self-pay

## 2021-07-08 ENCOUNTER — Other Ambulatory Visit: Payer: Self-pay

## 2021-07-08 ENCOUNTER — Emergency Department (HOSPITAL_COMMUNITY)
Admission: EM | Admit: 2021-07-08 | Discharge: 2021-07-09 | Disposition: A | Discharge status: Home / Self Care | Payer: Self-pay | Attending: Emergency Medicine | Admitting: Emergency Medicine

## 2021-07-08 DIAGNOSIS — R21 Rash and other nonspecific skin eruption: Secondary | ICD-10-CM | POA: Insufficient documentation

## 2021-07-08 NOTE — ED Triage Notes (Addendum)
Pt arrived POV from home c/o a rash that itches really bad and started on Thursday. Pt states it started on her arms and is all over her body now. Pt states she was seen on Thursday and given prednisone but it is not helping.  ?

## 2021-07-09 LAB — CBC
HCT: 35.8 % — ABNORMAL LOW (ref 36.0–46.0)
Hemoglobin: 11.5 g/dL — ABNORMAL LOW (ref 12.0–15.0)
MCH: 27.6 pg (ref 26.0–34.0)
MCHC: 32.1 g/dL (ref 30.0–36.0)
MCV: 85.9 fL (ref 80.0–100.0)
Platelets: 306 10*3/uL (ref 150–400)
RBC: 4.17 MIL/uL (ref 3.87–5.11)
RDW: 13.5 % (ref 11.5–15.5)
WBC: 10.3 10*3/uL (ref 4.0–10.5)
nRBC: 0 % (ref 0.0–0.2)

## 2021-07-09 MED ORDER — PREDNISONE 20 MG PO TABS: ORAL_TABLET | ORAL | 0 refills | Status: AC

## 2021-07-09 MED ORDER — HYDROXYZINE HCL 25 MG PO TABS: 25.0000 mg | ORAL_TABLET | Freq: Four times a day (QID) | ORAL | 0 refills | Status: AC

## 2021-07-09 NOTE — Discharge Instructions (Addendum)
Your platelet count was normal.  If you don't begin to improve in the next few days you'll need to be seen by a dermatologist. ?

## 2021-07-09 NOTE — ED Provider Notes (Signed)
?[place] DEPT ?[facility] Emergency Department ?Provider Note ?MRN:  [id number]  ?Arrival date & time: 07/09/21    ? ?Chief Complaint   ?Rash ?  ?History of Present Illness   ?Sandra Escobar is a 31 y.o. year-old female presents to the ED with chief complaint of rash x 3-4 days.  She states that it is itchy and affecting her entire body.  She denies any new contacts with allergens/soaps/detergents.  She has been on prednisone for 2 days and has been taking pepcid and benadryl as well.  She denies vomiting, diarrhea, fever, chills, or SOB. ? ?History provided by patient. ? ? ?Review of Systems  ?Pertinent review of systems noted in HPI.  ? ? ?Physical Exam  ? ?Vitals:  ? 07/08/21 1737 07/08/21 2226  ?BP: 117/68 104/66  ?Pulse: 78 (!) 57  ?Resp: 16 16  ?Temp: 98.9 ?F (37.2 ?C)   ?SpO2: 98% 100%  ?  ?CONSTITUTIONAL:  well-appearing, NAD ?NEURO:  Alert and oriented x 3, CN 3-12 grossly intact ?EYES:  eyes equal and reactive ?ENT/NECK:  Supple, no stridor  ?CARDIO:  normal rate, regular rhythm, appears well-perfused  ?PULM:  No respiratory distress, CTAB ?GI/GU:  non-distended,  ?MSK/SPINE:  No gross deformities, no edema, moves all extremities  ?SKIN:  no rash, atraumatic, erythematous macular rash scattered on extremities and abdomen ? ? ?*Additional and/or pertinent findings included in MDM below ? ?Diagnostic and Interventional Summary  ? ? EKG Interpretation ? ?Date/Time:    ?Ventricular Rate:    ?PR Interval:    ?QRS Duration:   ?QT Interval:    ?QTC Calculation:   ?R Axis:     ?Text Interpretation:   ?  ? ?  ? ?Labs Reviewed  ?CBC - Abnormal; Notable for the following components:  ?    Result Value  ? Hemoglobin 11.5 (*)   ? HCT 35.8 (*)   ? All other components within normal limits  ?  ?No orders to display  ?  ?Medications - No data to display  ? ?Procedures  /  Critical Care ?Procedures ? ?ED Course and Medical Decision Making  ?I have reviewed the triage vital signs, the nursing notes,  and pertinent available records from the EMR. ? ?Social Determinants Affecting Complexity of Care: ?Patient has no clinically significant social determinants affecting this chief complaint.. ? ? ?ED Course: ?  ?Patient here with rash.  Top differential diagnoses include urticaria, allergic reaction. ?Medical Decision Making ?Problems Addressed: ?Rash: acute illness or injury ?   Details: Continue prednisone add atarax.  Recommend derm follow-up. ? ?Amount and/or Complexity of Data Reviewed ?External Data Reviewed: notes. ?   Details: Recent treatment at outside [facility] with steroids, pepcid, ?Labs: ordered. ?   Details: normal PLT count.  No ITP ? ?Risk ?Prescription drug management. ? ?  ? ?Consultants: ?No consultations were needed in caring for this patient. ? ? ?Treatment and Plan: ?Emergency department workup does not suggest an emergent condition requiring admission or immediate intervention beyond  what has been performed at this time. The patient is safe for discharge and has  been instructed to return immediately for worsening symptoms, change in  symptoms or any other concerns ? ? ? ?Final Clinical Impressions(s) / ED Diagnoses  ? ?  ICD-10-CM   ?1. Rash  R21   ?  ?  ?ED Discharge Orders   ? ?      Ordered  ?  hydrOXYzine (ATARAX) 25 MG tablet  Every 6 hours       ?  07/09/21 0224  ?  predniSONE (DELTASONE) 20 MG tablet       ? 07/09/21 0224  ? ?  ?  ? ?  ?  ? ? ?Discharge Instructions Discussed with and Provided to Patient:  ? ? ? ?Discharge Instructions   ? ?  ?Your platelet count was normal.  If you don't begin to improve in the next few days you'll need to be seen by a dermatologist. ? ? ? ? ?  ?Montine Circle, PA-C ?07/09/21 0229 ? ?  ?Merrily Pew, MD ?07/09/21 [phone] ? ?

## 2021-07-09 NOTE — ED Notes (Signed)
Pt does have a rash over her abdomen, legs, arms which started Thursday.  [facility] but "they don't work either."  Was also taking benadryl w/out success.   ?
